# Patient Record
Sex: Female | Born: 1980 | Hispanic: Yes | Marital: Married | Smoking: Never smoker
Health system: Southern US, Community
[De-identification: ages and names within clinical notes are randomized; demographics above are authoritative.]

---

## 2004-06-30 ENCOUNTER — Inpatient Hospital Stay: Payer: Self-pay | Admitting: Obstetrics and Gynecology

## 2009-06-15 ENCOUNTER — Ambulatory Visit: Payer: Self-pay | Admitting: Family Medicine

## 2009-11-29 ENCOUNTER — Inpatient Hospital Stay: Payer: Self-pay | Admitting: Obstetrics and Gynecology

## 2013-06-10 ENCOUNTER — Ambulatory Visit: Payer: Self-pay | Admitting: Physician Assistant

## 2013-07-15 ENCOUNTER — Ambulatory Visit: Payer: Self-pay | Admitting: Family Medicine

## 2013-11-13 ENCOUNTER — Inpatient Hospital Stay: Payer: Self-pay | Admitting: Obstetrics and Gynecology

## 2013-11-13 LAB — CBC WITH DIFFERENTIAL/PLATELET
Basophil #: 0 10*3/uL (ref 0.0–0.1)
Basophil %: 0.4 %
EOS PCT: 0.3 %
Eosinophil #: 0 10*3/uL (ref 0.0–0.7)
HCT: 40.6 % (ref 35.0–47.0)
HGB: 13.5 g/dL (ref 12.0–16.0)
Lymphocyte #: 1.2 10*3/uL (ref 1.0–3.6)
Lymphocyte %: 9.9 %
MCH: 31.2 pg (ref 26.0–34.0)
MCHC: 33.2 g/dL (ref 32.0–36.0)
MCV: 94 fL (ref 80–100)
Monocyte #: 0.7 x10 3/mm (ref 0.2–0.9)
Monocyte %: 5.3 %
NEUTROS PCT: 84.1 %
Neutrophil #: 10.6 10*3/uL — ABNORMAL HIGH (ref 1.4–6.5)
Platelet: 197 10*3/uL (ref 150–440)
RBC: 4.33 10*6/uL (ref 3.80–5.20)
RDW: 14.2 % (ref 11.5–14.5)
WBC: 12.6 10*3/uL — ABNORMAL HIGH (ref 3.6–11.0)

## 2013-11-14 LAB — HEMATOCRIT: HCT: 31 % — ABNORMAL LOW (ref 35.0–47.0)

## 2014-06-11 NOTE — Op Note (Signed)
PATIENT NAME:  Teresa Pratt, Teresa Pratt MR#:  [id number] DATE OF BIRTH:  Apr 11, 1980  DATE OF PROCEDURE:  11/13/2013  PREOPERATIVE DIAGNOSES:  1. A 35 + 2 weeks estimated gestational age. 2. Breech presentation.  3. Active labor.   POSTOPERATIVE DIAGNOSES:  1. A 35 + 2 weeks estimated gestational age. 2. Transverse lie.   PROCEDURES:  1. Primary low transverse cesarean section.  2. Breech extraction.   ANESTHESIA: Spinal.   SURGEON: Suzy Bouchardhomas J. Schermerhorn, M.D.   INDICATION: This is a 34 year old, gravida 3, para 2 patient who presented to labor and delivery with active contractions. The cervix was 5 cm with (Dictation Anomaly) <<no presenting partT>> presenting part. Ultrasound was performed that demonstrated the baby to be in the breech presentation.   PROCEDURE: After adequate spinal anesthesia, the patient was placed in the dorsal supine position with a hip roll under the right side. The patient did receive 2 grams IV Ancef prior to commencement of the case. The patient was prepped and draped in normal sterile fashion. Pfannenstiel incision was made 2 fingerbreadths above the symphysis pubis. Sharp dissection was used to identify the fascia. The fascia was opened in the midline and opened in a transverse fashion. The superior aspect of the fascia was grasped with Coker clamps and the recti muscles dissected free. The inferior aspect of the fascia was grasped with Coker clamps and the pyramidalis muscle was dissected free. Entry into the peritoneal cavity was accomplished sharply. The vesicouterine peritoneal fold was identified and a bladder flap was created, and the bladder was reflected inferiorly. A low transverse uterine incision was made. Upon entry into the endometrial cavity, clear fluid was noted. The fetus was deemed to be in the transverse lie. Surgeon's hand brought the fetal buttocks down to the uterine incision, and the left leg followed by the right leg were delivered, followed  by each arm with a sweeping motion to the anterior part of the fetus, and the head was delivered with a Mauriceau-Smellie-Veit maneuver (Dictation Anomaly) <<was performed and the delivery yielded a>> floppy infant. The cord was doubly clamped and passed to nursery staff who assigned Apgar scores of 8 and 9. The placenta was delivered manually and the uterus was exteriorized. The endometrial cavity was wiped clean with laparotomy tape. There were several adherent membranous tissues noted at the fundus that were ultimately teased out. Good hemostasis was noted. The uterine incision was closed with 1 chromic suture in a running locking fashion. One additional figure-of-eight suture required for good hemostasis. Fallopian tubes and ovaries appeared normal. The posterior cul-de-sac was irrigated and suctioned without difficulty. The uterus was placed back into the abdominal cavity. The uterine incision again appeared hemostatic. The paracolic gutters were wiped clean with laparotomy tape and Interceed was placed over the uterine incision in a T-shaped fashion without difficulty. The fascia was then closed with 0 Vicryl suture in a running nonlocking fashion with good approximation of edges. Good hemostasis was noted. Subcutaneous tissues were irrigated and bovied for hemostasis and the skin was reapproximated with the Insorb absorbable stapler. There were no complications.   Estimated blood loss, 600 mL. Intraoperative fluids, 1500 mL.   The patient tolerated the procedure well and was taken to the recovery room in good condition.    ____________________________ Suzy Bouchardhomas J. Schermerhorn, MD tjs:JT D: 11/13/2013 16:37:28 ET T: 11/14/2013 08:23:30 ET JOB#: [id number]  cc: Suzy Bouchardhomas J. Schermerhorn, MD, <Dictator> Suzy BouchardHOMAS J SCHERMERHORN MD ELECTRONICALLY SIGNED 11/17/2013 12:29

## 2014-06-28 NOTE — H&P (Signed)
L&D Evaluation:  History:  HPI 34 yo G3P2 at 37+2 weeks presents with CTX . Cx 5cm  with no presenting part . U/S bedside that verifies Breech presentation   Presents with contractions   Patient's Medical History No Chronic Illness   Patient's Surgical History none   Medications Pre Natal Vitamins   Allergies NKDA   Social History none   Family History Non-Contributory   ROS:  ROS All systems were reviewed.  HEENT, CNS, GI, GU, Respiratory, CV, Renal and Musculoskeletal systems were found to be normal.   Exam:  Vital Signs stable   General no apparent distress   Mental Status clear   Chest clear   Heart normal sinus rhythm   Abdomen gravid, non-tender   Estimated Fetal Weight Average for gestational age   Back no CVAT   Pelvic 5 cm / 90 /OOP   Mebranes Intact   FHT normal rate with no decels   Fetal Heart Rate 130   Impression:  Impression active labor, breech presentation   Plan:  Plan Primary LTCS . Pt declines BTL .  Indication for procedure and risks of the procedure is explained to the patient via the translator . All questions have been answered  One 0.25 mg sq dose of terbutaline used to decrease ctx .   Electronic Signatures: Nakeysha Pasqual, Ihor Austinhomas J (MD)  (Signed 26-Sep-15 15:09)  Authored: L&D Evaluation   Last Updated: 26-Sep-15 15:09 by Suzy BouchardSchermerhorn, Gerasimos Plotts J (MD)

## 2017-06-04 ENCOUNTER — Ambulatory Visit
Admission: RE | Admit: 2017-06-04 | Discharge: 2017-06-04 | Disposition: A | Discharge status: Home / Self Care | Payer: Self-pay | Source: Ambulatory Visit | Attending: Oncology | Admitting: Oncology

## 2017-06-04 ENCOUNTER — Ambulatory Visit: Payer: Self-pay | Attending: Oncology

## 2017-06-04 ENCOUNTER — Encounter (INDEPENDENT_AMBULATORY_CARE_PROVIDER_SITE_OTHER): Payer: Self-pay

## 2017-06-04 ENCOUNTER — Ambulatory Visit
Admission: RE | Admit: 2017-06-04 | Discharge: 2017-06-04 | Disposition: A | Discharge status: Home / Self Care | Payer: Self-pay | Source: Ambulatory Visit

## 2017-06-04 VITALS — BP 107/69 | HR 71 | Temp 96.5°F | Resp 18 | Ht 63.0 in | Wt 172.0 lb

## 2017-06-04 DIAGNOSIS — N63 Unspecified lump in unspecified breast: Secondary | ICD-10-CM

## 2017-06-04 DIAGNOSIS — N6002 Solitary cyst of left breast: Secondary | ICD-10-CM | POA: Insufficient documentation

## 2017-06-04 DIAGNOSIS — N6322 Unspecified lump in the left breast, upper inner quadrant: Secondary | ICD-10-CM | POA: Insufficient documentation

## 2017-06-04 DIAGNOSIS — N6324 Unspecified lump in the left breast, lower inner quadrant: Secondary | ICD-10-CM | POA: Insufficient documentation

## 2017-06-04 NOTE — Progress Notes (Signed)
Subjective:     Patient ID: Teresa GunningJessica Escudero Pratt, female   DOB: 01-Apr-1980, 37 y.o.   MRN: [id number]  HPI   Review of Systems     Objective:   Physical Exam  Pulmonary/Chest: Right breast exhibits no inverted nipple, no mass, no nipple discharge, no skin change and no tenderness. Left breast exhibits mass. Left breast exhibits no inverted nipple, no nipple discharge, no skin change and no tenderness. Breasts are symmetrical.    Left breast nodule between 8-9 o'clock       Assessment:     37 year old hispanic patient presents for [facility] clinic visit.  Language line used to interpret exam.   Complains of left breast lump first noted in October.  Reports "a little bigger" since initial finding  No previous mammogram..   Patient screened, and meets [facility] eligibility.  Patient does not have insurance, Medicare or Medicaid.  Handout given on Affordable Care Act.  Instructed patient on breast self awareness using teach back method. CBE reveals fibroglandular breast tissue bilateral.  Palpated small nodule left breast between 8 and 9 o'clock.      Plan:     Sent for bilateral diagnostic mammogram, and ultrasound.

## 2017-06-04 NOTE — Progress Notes (Signed)
Sent for bilateral diagnostic mammogram and ultrasound.  Results Birads 3.  Patient scheduled to return for 6 month follow-up left breast mammogram, and ultrasound on October 21 at 11:00.  Mailed appointment information.   Copy to [facility].

## 2017-06-05 ENCOUNTER — Other Ambulatory Visit: Payer: Self-pay

## 2017-06-05 DIAGNOSIS — N63 Unspecified lump in unspecified breast: Secondary | ICD-10-CM

## 2017-12-08 ENCOUNTER — Ambulatory Visit
Admission: RE | Admit: 2017-12-08 | Discharge: 2017-12-08 | Disposition: A | Discharge status: Home / Self Care | Payer: Self-pay | Source: Ambulatory Visit | Attending: Oncology | Admitting: Oncology

## 2017-12-08 ENCOUNTER — Encounter: Payer: Self-pay | Admitting: Radiology

## 2017-12-08 DIAGNOSIS — N63 Unspecified lump in unspecified breast: Secondary | ICD-10-CM

## 2018-01-01 ENCOUNTER — Other Ambulatory Visit: Payer: Self-pay

## 2018-01-01 DIAGNOSIS — N63 Unspecified lump in unspecified breast: Secondary | ICD-10-CM

## 2018-01-01 NOTE — Progress Notes (Signed)
Patient scheduled for annual and 6 month follow-up [facility] appointment on 06/10/18 at 8:30 a.m for left breast mass .  Mamm[facility], and ultrasound [facility]  [facility].  Mailed appointment information to patient.

## 2018-02-22 NOTE — Progress Notes (Signed)
Letter mailed to notify patient of annual [facility] and six month follow-up appointment on 06/10/2018 at 8:30.  Mammogram and ultrasound to follow. Copy to [facility].

## 2018-06-04 ENCOUNTER — Encounter: Payer: Self-pay | Admitting: *Deleted

## 2018-06-04 NOTE — Progress Notes (Signed)
Called patient today via Fuller Song, the interpreter to re-establish [facility] eligibility.  Due to COVID 10 we are not seeing patients in the cancer center at this time.  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  Patient states no changes, only the "same pain" as she has had previously.  She is scheduled for her 6 month follow up and annual mammogram on 06/10/18.  Orders are in place for bilateral diagnostic mammogram and ultrasound.  Will follow up as indicated per [facility] protocol.

## 2018-06-10 ENCOUNTER — Ambulatory Visit
Admission: RE | Admit: 2018-06-10 | Discharge: 2018-06-10 | Disposition: A | Discharge status: Home / Self Care | Payer: Self-pay | Source: Ambulatory Visit | Attending: Oncology | Admitting: Oncology

## 2018-06-10 ENCOUNTER — Other Ambulatory Visit: Payer: Self-pay

## 2018-06-10 ENCOUNTER — Ambulatory Visit: Payer: Self-pay | Attending: Oncology

## 2018-06-10 DIAGNOSIS — N63 Unspecified lump in unspecified breast: Secondary | ICD-10-CM

## 2018-06-19 ENCOUNTER — Encounter: Payer: Self-pay | Admitting: *Deleted

## 2018-06-19 NOTE — Progress Notes (Signed)
Letter mailed to inform patient of her next appointment on 06/16/19 @ 8:00.

## 2019-06-16 ENCOUNTER — Ambulatory Visit
Admission: RE | Admit: 2019-06-16 | Discharge: 2019-06-16 | Disposition: A | Discharge status: Home / Self Care | Payer: Self-pay | Source: Ambulatory Visit | Attending: Oncology | Admitting: Oncology

## 2019-06-16 ENCOUNTER — Other Ambulatory Visit: Payer: Self-pay

## 2019-06-16 ENCOUNTER — Encounter: Payer: Self-pay | Admitting: *Deleted

## 2019-06-16 ENCOUNTER — Ambulatory Visit: Payer: Self-pay | Attending: Oncology | Admitting: *Deleted

## 2019-06-16 VITALS — BP 114/75 | HR 70 | Temp 97.9°F | Ht 62.75 in | Wt 170.1 lb

## 2019-06-16 DIAGNOSIS — N63 Unspecified lump in unspecified breast: Secondary | ICD-10-CM

## 2019-06-16 NOTE — Progress Notes (Signed)
Letter mailed from the [facility] to inform patient of her normal mammogram results.  Patient is to follow-up with annual screening in one year. 

## 2019-06-16 NOTE — Progress Notes (Signed)
  Subjective:     Patient ID: Teresa Pratt, female   DOB: 10/12/1980, 39 y.o.   MRN: [id number]  HPI   Review of Systems     Objective:   Physical Exam Chest:     Breasts:        Right: No swelling, bleeding, inverted nipple, mass, nipple discharge, skin change or tenderness.        Left: No swelling, bleeding, inverted nipple, mass, nipple discharge, skin change or tenderness.    Lymphadenopathy:     Upper Body:     Right upper body: No supraclavicular or axillary adenopathy.     Left upper body: No supraclavicular or axillary adenopathy.        Assessment:     39 year old Hispanic female returns to [facility] for annual diagnostic mammogram and ultrasound.  She has been followed for a left breast mass for the last couple of years.  Jacqui, the interpreter present during the interview and exam.  On clinical breast exam I cannot palpate a dominant mass at the area of concern, but I can palpate fibroglandular like tissue.  There is no nipple discharge, skin changes or lymphadenopathy.  Taught self breast awareness.  Patient has been screened for eligibility.  She does not have any insurance, Medicare or Medicaid.  She also meets financial eligibility.   Risk Assessment    Risk Scores      06/16/2019 06/04/2018   Last edited by: Alta Corning, CMA Jim Like, RN   5-year risk: 0.3 % 0.2 %   Lifetime risk: 5.8 % 5.9 %            Plan:     Diagnostic mammogram and ultrasound ordered.  Informed patient she will need to start routine screenings at age 39 if this mammogram is normal.  She is agreeable.  Will follow up per [facility] protocol.

## 2019-06-16 NOTE — Patient Instructions (Signed)
Gave patient hand-out, Women Staying Healthy, Active and Well from [facility], with education on breast health, pap smears, heart and colon health. 

## 2021-08-21 IMAGING — MG DIGITAL DIAGNOSTIC BILAT W/ TOMO W/ CAD
8 series · 8 of 24 positions shown · non-contrast
Comparison: Previous exam(s).

CLINICAL DATA: Follow-up probably benign left breast mass.

EXAM:
DIGITAL DIAGNOSTIC LEFT MAMMOGRAM WITH CAD AND TOMO
ULTRASOUND LEFT BREAST

[L MLO synth-2D]
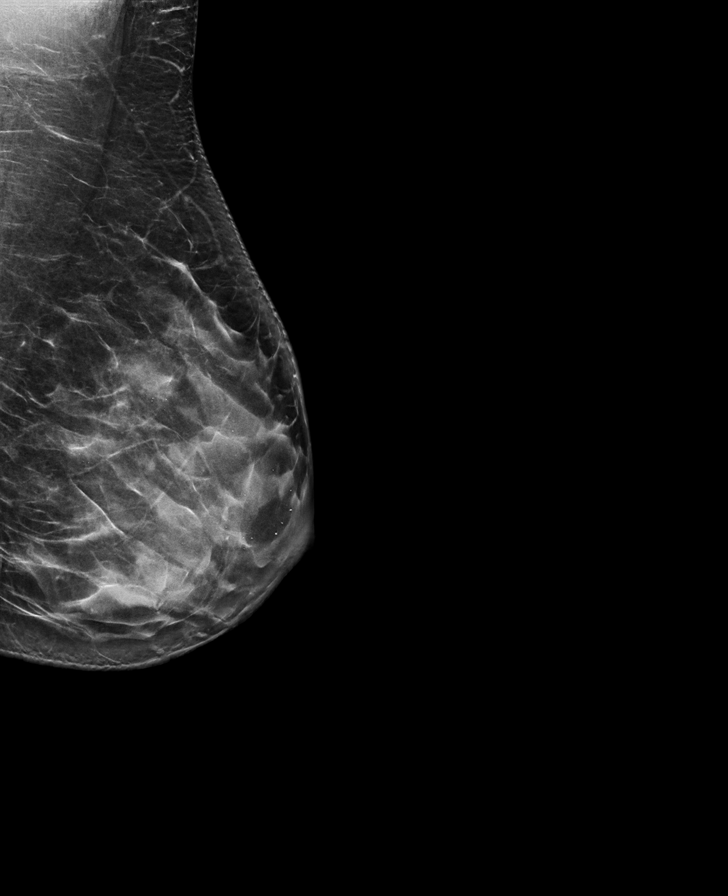

[R MLO synth-2D]
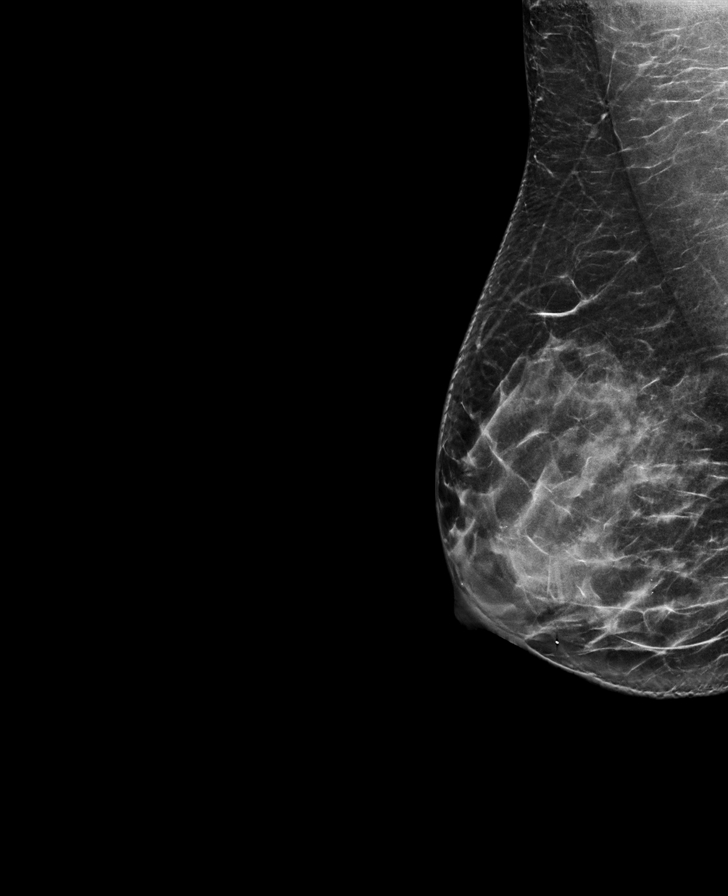

[R CC synth-2D]
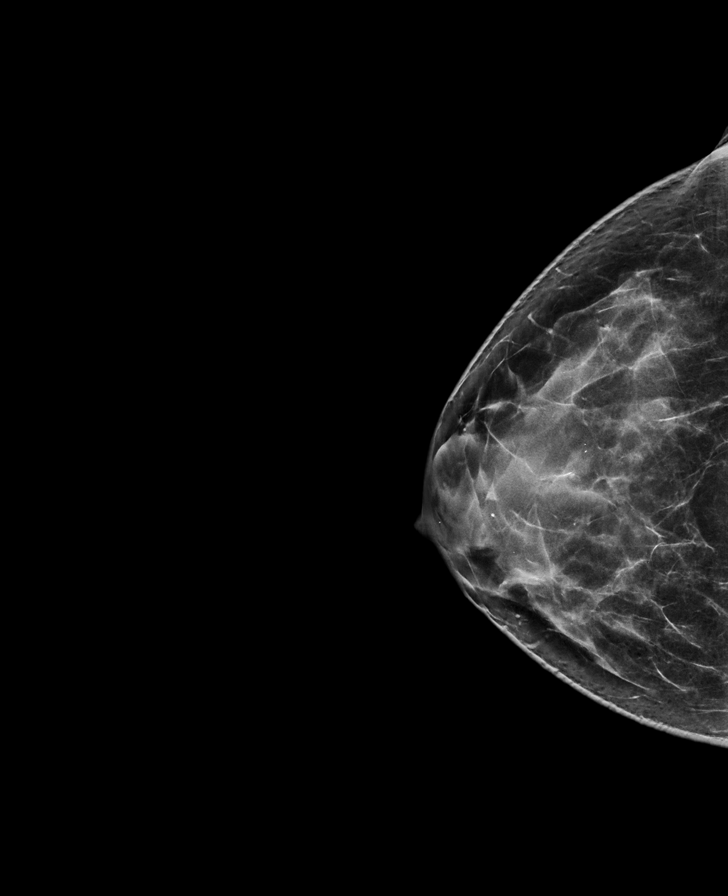

[L CC synth-2D]
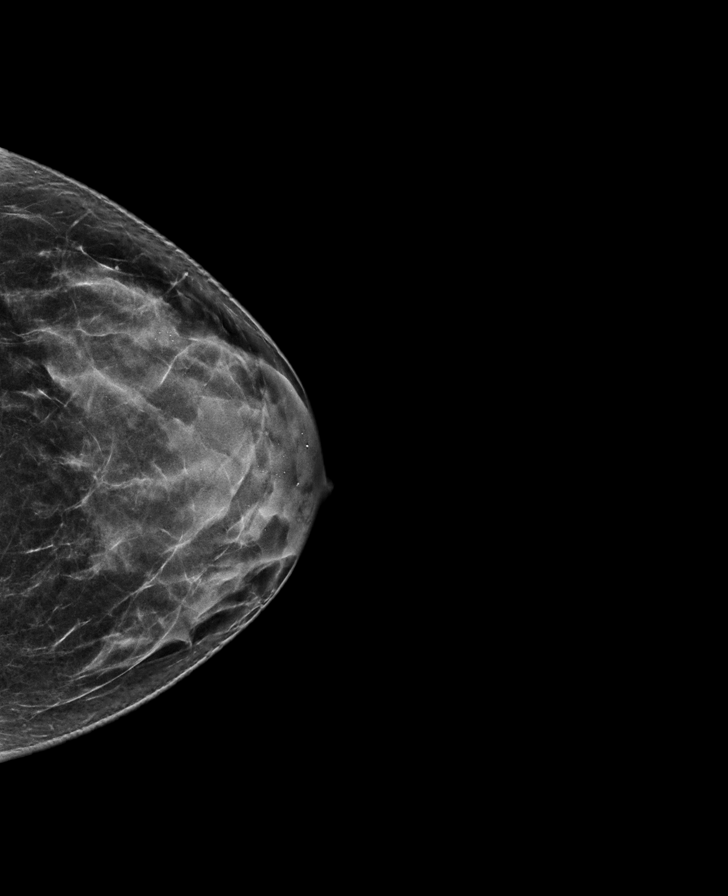

[L MLO tomo · tomo slice 37/72.0]
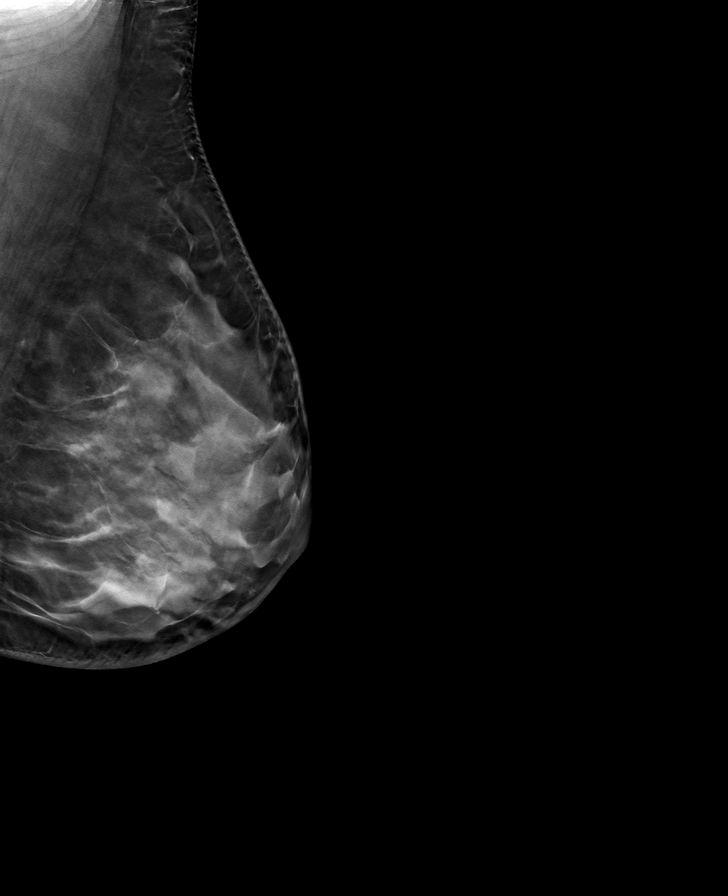

[L CC tomo · tomo slice 32/63.0]
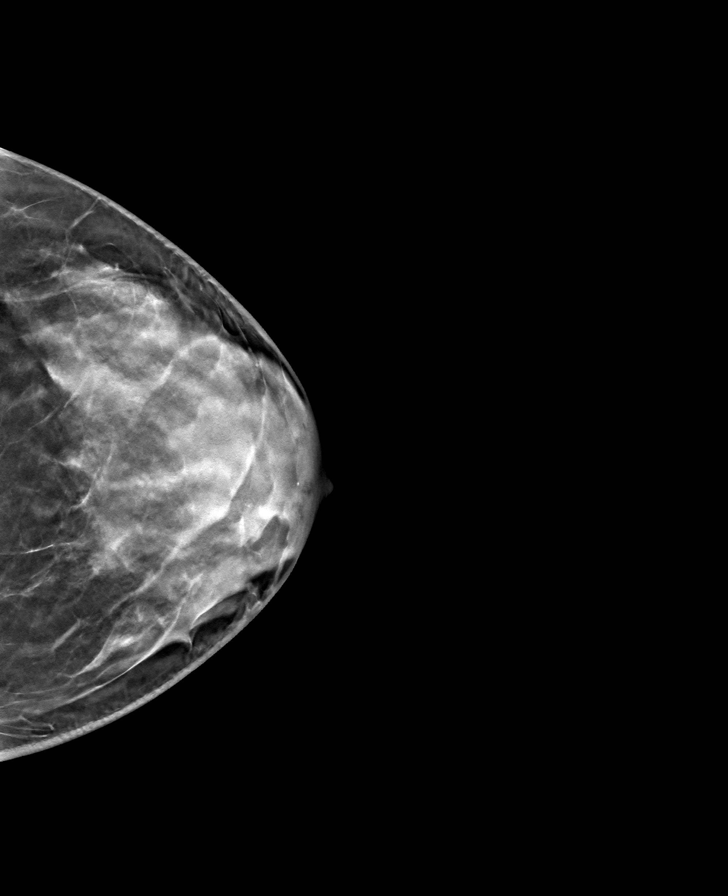

[R CC tomo · tomo slice 35/70.0]
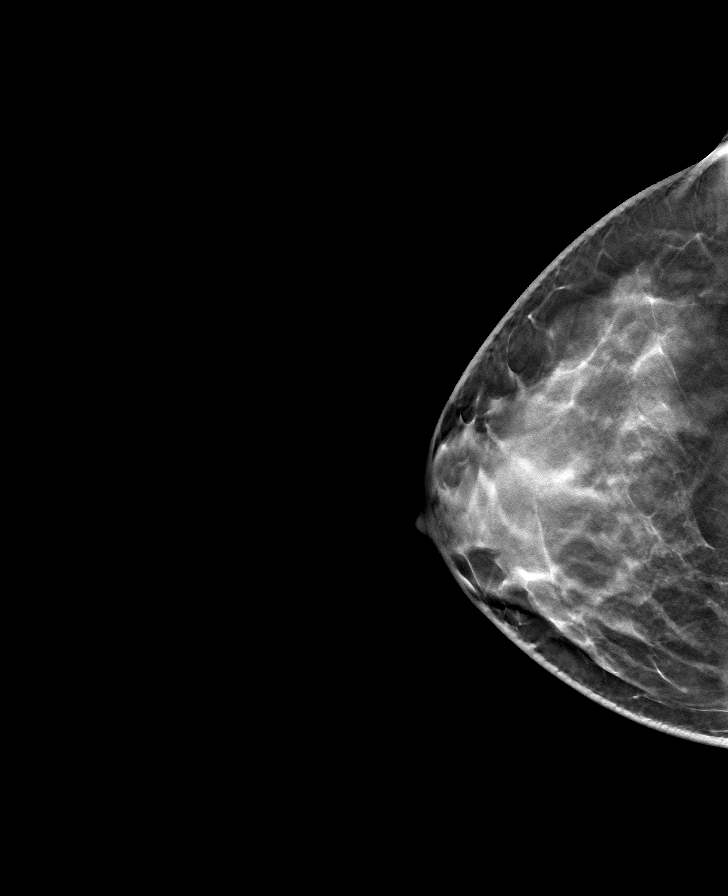

[R MLO tomo · tomo slice 35/68.0]
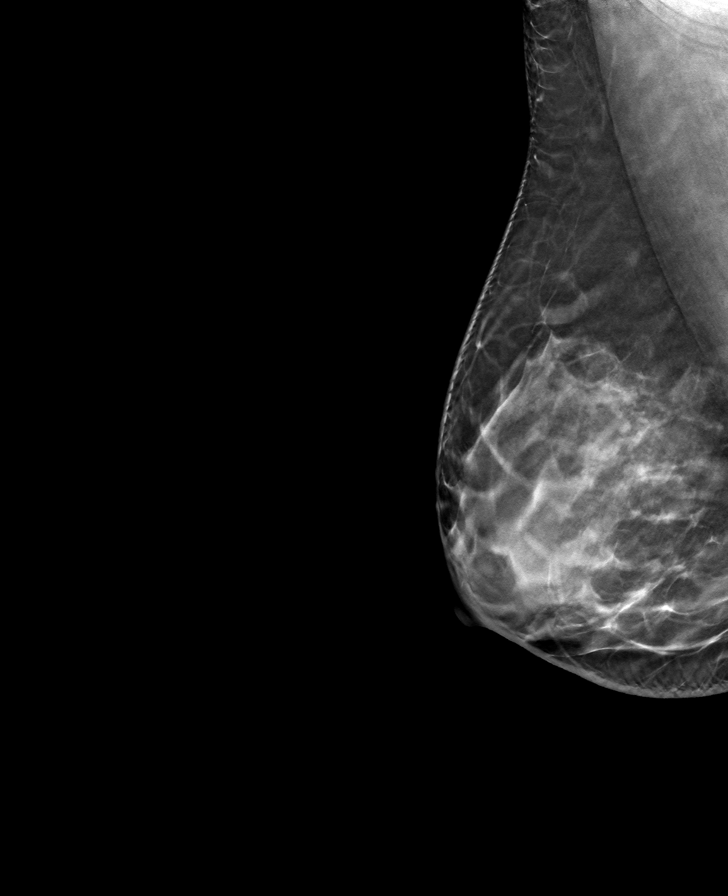

[8 of 24 positions shown; findings below may reference images not displayed]

ACR Breast Density Category c: The breast tissue is heterogeneously
dense, which may obscure small masses.
FINDINGS: Bilateral benign calcifications are stable. No suspicious masses or
distortion identified mammographically.

Mammographic images were processed with CAD.

On physical exam, no suspicious lumps are identified.

Targeted ultrasound is performed, showing a stable mass at 9
o'clock, 2 cm from the nipple measuring 8 x 4 by 8 mm today versus 9
by 4 x 8 mm in 5327.
IMPRESSION: The left breast mass is now considered benign given 2 years of
stability. No evidence of malignancy in either breast.

RECOMMENDATION:
Annual screening mammography.

I have discussed the findings and recommendations with the patient.
If applicable, a reminder letter will be sent to the patient
regarding the next appointment.

BI-RADS CATEGORY  2: Benign.

## 2023-01-02 ENCOUNTER — Encounter: Payer: Self-pay | Admitting: Intensive Care

## 2023-01-02 ENCOUNTER — Emergency Department: Payer: Self-pay

## 2023-01-02 ENCOUNTER — Other Ambulatory Visit: Payer: Self-pay

## 2023-01-02 ENCOUNTER — Emergency Department
Admission: EM | Admit: 2023-01-02 | Discharge: 2023-01-02 | Disposition: A | Discharge status: Home / Self Care | Payer: Self-pay | Attending: Emergency Medicine | Admitting: Emergency Medicine

## 2023-01-02 DIAGNOSIS — N132 Hydronephrosis with renal and ureteral calculous obstruction: Secondary | ICD-10-CM | POA: Insufficient documentation

## 2023-01-02 DIAGNOSIS — N200 Calculus of kidney: Secondary | ICD-10-CM

## 2023-01-02 LAB — CBC
HCT: 40.3 % (ref 36.0–46.0)
Hemoglobin: 13.7 g/dL (ref 12.0–15.0)
MCH: 30.7 pg (ref 26.0–34.0)
MCHC: 34 g/dL (ref 30.0–36.0)
MCV: 90.4 fL (ref 80.0–100.0)
Platelets: 336 10*3/uL (ref 150–400)
RBC: 4.46 MIL/uL (ref 3.87–5.11)
RDW: 11.9 % (ref 11.5–15.5)
WBC: 13.9 10*3/uL — ABNORMAL HIGH (ref 4.0–10.5)
nRBC: 0 % (ref 0.0–0.2)

## 2023-01-02 LAB — COMPREHENSIVE METABOLIC PANEL
ALT: 28 U/L (ref 0–44)
AST: 19 U/L (ref 15–41)
Albumin: 3.8 g/dL (ref 3.5–5.0)
Alkaline Phosphatase: 71 U/L (ref 38–126)
Anion gap: 10 (ref 5–15)
BUN: 19 mg/dL (ref 6–20)
CO2: 20 mmol/L — ABNORMAL LOW (ref 22–32)
Calcium: 8.6 mg/dL — ABNORMAL LOW (ref 8.9–10.3)
Chloride: 103 mmol/L (ref 98–111)
Creatinine, Ser: 0.89 mg/dL (ref 0.44–1.00)
GFR, Estimated: >60 mL/min (ref >60)
Glucose, Bld: 123 mg/dL — ABNORMAL HIGH (ref 70–99)
Potassium: 3.8 mmol/L (ref 3.5–5.1)
Sodium: 133 mmol/L — ABNORMAL LOW (ref 135–145)
Total Bilirubin: 0.4 mg/dL (ref <1.2)
Total Protein: 7.1 g/dL (ref 6.5–8.1)

## 2023-01-02 LAB — URINALYSIS, ROUTINE W REFLEX MICROSCOPIC
Bilirubin Urine: NEGATIVE
Glucose, UA: NEGATIVE mg/dL
Ketones, ur: NEGATIVE mg/dL
Nitrite: NEGATIVE
Protein, ur: NEGATIVE mg/dL
Specific Gravity, Urine: 1.025 (ref 1.005–1.030)
pH: 5 (ref 5.0–8.0)

## 2023-01-02 LAB — LIPASE, BLOOD: Lipase: 27 U/L (ref 11–51)

## 2023-01-02 LAB — POC URINE PREG, ED: Preg Test, Ur: NEGATIVE

## 2023-01-02 MED ORDER — KETOROLAC TROMETHAMINE 30 MG/ML IJ SOLN
30.0000 mg | Freq: Once | INTRAMUSCULAR | Status: AC
  Administered 2023-01-02: 30 mg via INTRAVENOUS
  Filled 2023-01-02: qty 1

## 2023-01-02 MED ORDER — MORPHINE SULFATE (PF) 4 MG/ML IV SOLN
4.0000 mg | Freq: Once | INTRAVENOUS | Status: AC
  Administered 2023-01-02: 4 mg via INTRAVENOUS
  Filled 2023-01-02: qty 1

## 2023-01-02 MED ORDER — IOHEXOL 300 MG/ML  SOLN
100.0000 mL | Freq: Once | INTRAMUSCULAR | Status: AC | PRN
Start: 2023-01-02 — End: 2023-01-02
  Administered 2023-01-02: 100 mL via INTRAVENOUS

## 2023-01-02 MED ORDER — ONDANSETRON HCL 4 MG/2ML IJ SOLN
4.0000 mg | Freq: Once | INTRAMUSCULAR | Status: AC
  Administered 2023-01-02: 4 mg via INTRAVENOUS
  Filled 2023-01-02: qty 2

## 2023-01-02 MED ORDER — KETOROLAC TROMETHAMINE 10 MG PO TABS
10.0000 mg | ORAL_TABLET | Freq: Four times a day (QID) | ORAL | 0 refills | Status: AC | PRN
Start: 2023-01-02 — End: ?

## 2023-01-02 NOTE — ED Triage Notes (Signed)
Patient c/o lower abdominal pain since 0200 today. Reports N/V. Denies diarrhea

## 2023-01-02 NOTE — ED Provider Notes (Signed)
[facility] Provider Note    Event Date/Time   First MD Initiated Contact with Patient 01/02/23 0750     (approximate)  History   Chief Complaint: Abdominal Pain  HPI  Teresa Pratt is a 42 y.o. female with no significant past medical history who presents to the emergency department for lower abdominal pain.  According to the patient since 2 AM this morning she has been experiencing pain throughout the lower abdomen as well as nausea and 1 episode of vomiting.  Patient denies any urinary symptoms denies any vaginal bleeding or discharge.  Last menstrual period 2 to 3 weeks ago.  No fever.  Patient does state a history of a C-section but no other abdominal surgeries.  Physical Exam   Triage Vital Signs: ED Triage Vitals  Encounter Vitals Group     BP 01/02/23 0738 124/82     Systolic BP Percentile --      Diastolic BP Percentile --      Pulse Rate 01/02/23 0738 71     Resp 01/02/23 0738 16     Temp 01/02/23 0734 98.1 F (36.7 C)     Temp Source 01/02/23 0734 Oral     SpO2 01/02/23 0738 97 %     Weight 01/02/23 0735 185 lb (83.9 kg)     Height 01/02/23 0735 5' 0.63" (1.54 m)     Head Circumference --      Peak Flow --      Pain Score 01/02/23 0734 9     Pain Loc --      Pain Education --      Exclude from Growth Chart --     Most recent vital signs: Vitals:   01/02/23 0734 01/02/23 0738  BP:  124/82  Pulse:  71  Resp:  16  Temp: 98.1 F (36.7 C)   SpO2:  97%    General: Awake, no distress.  CV:  Good peripheral perfusion.  Regular rate and rhythm  Resp:  Normal effort.  Equal breath sounds bilaterally.  Abd:  No distention.  Soft, mild to moderate diffuse abdominal tenderness somewhat worse in the lower abdomen.  No rebound or guarding.  No focal tenderness identified.  ED Results / Procedures / Treatments   RADIOLOGY  I have reviewed and interpreted CT images.  I do not appreciate any significant abnormality such as  obstruction. Radiology is read the CT scan as consistent with an obstructing 3 mm left UVJ stone.   MEDICATIONS ORDERED IN ED: Medications  morphine (PF) 4 MG/ML injection 4 mg (has no administration in time range)  ondansetron (ZOFRAN) injection 4 mg (has no administration in time range)     IMPRESSION / MDM / ASSESSMENT AND PLAN / ED COURSE  I reviewed the triage vital signs and the nursing notes.  Patient's presentation is most consistent with acute presentation with potential threat to life or bodily function.  Patient presents the emergency department for lower abdominal pain nausea vomiting since 2 AM this morning.  Moderate tenderness to palpation in the periumbilical area mild tenderness throughout.  No fever.  1 episode of vomiting.  Differential is quite broad at this time but would include appendicitis, diverticulitis, colitis, gastroenteritis, UTI or pyelonephritis.  Lab work is pending we will proceed with labs and likely CT scan of the abdomen/pelvis to further evaluate.  Patient agreeable to plan.  We will treat with nausea medication and pain medication while awaiting results.  CT shows a  3 mm left UVJ stone.  Give the patient Toradol and she is now pain-free.  Urinalysis shows no concerning findings no sign of infection.  Urine culture was sent as a precaution.  CBC shows slight leukocytosis, chemistry reassuring.  FINAL CLINICAL IMPRESSION(S) / ED DIAGNOSES   Abdominal pain Ureterolithiasis    Note:  This document was prepared using Dragon voice recognition software and may include unintentional dictation errors.   Minna Antis, MD 01/02/23 1053
